# Patient Record
Sex: Male | Born: 1985 | Hispanic: No | Marital: Single | Smoking: Never smoker
Health system: Southern US, Community
[De-identification: ages and names within clinical notes are randomized; demographics above are authoritative.]

## PROBLEM LIST (undated history)

## (undated) HISTORY — PX: KNEE SURGERY: SHX244

---
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
## 2004-04-04 ENCOUNTER — Emergency Department (HOSPITAL_COMMUNITY): Admission: EM | Admit: 2004-04-04 | Discharge: 2004-04-04 | Payer: Self-pay | Admitting: Emergency Medicine

## 2005-07-08 ENCOUNTER — Emergency Department (HOSPITAL_COMMUNITY): Admission: EM | Admit: 2005-07-08 | Discharge: 2005-07-08 | Payer: Self-pay | Admitting: Emergency Medicine

## 2005-08-05 ENCOUNTER — Emergency Department (HOSPITAL_COMMUNITY): Admission: EM | Admit: 2005-08-05 | Discharge: 2005-08-05 | Payer: Self-pay | Admitting: Emergency Medicine

## 2006-08-20 ENCOUNTER — Emergency Department (HOSPITAL_COMMUNITY): Admission: EM | Admit: 2006-08-20 | Discharge: 2006-08-20 | Payer: Self-pay | Admitting: Emergency Medicine

## 2007-10-09 ENCOUNTER — Emergency Department (HOSPITAL_COMMUNITY): Admission: EM | Admit: 2007-10-09 | Discharge: 2007-10-09 | Payer: Self-pay | Admitting: Emergency Medicine

## 2008-01-29 ENCOUNTER — Emergency Department (HOSPITAL_COMMUNITY): Admission: EM | Admit: 2008-01-29 | Discharge: 2008-01-30 | Payer: Self-pay | Admitting: Emergency Medicine

## 2009-07-19 ENCOUNTER — Emergency Department (HOSPITAL_COMMUNITY): Admission: EM | Admit: 2009-07-19 | Discharge: 2009-07-19 | Payer: Self-pay | Admitting: Emergency Medicine

## 2009-11-09 ENCOUNTER — Emergency Department (HOSPITAL_COMMUNITY): Admission: EM | Admit: 2009-11-09 | Discharge: 2009-11-09 | Payer: Self-pay | Admitting: Emergency Medicine

## 2010-11-17 ENCOUNTER — Emergency Department (HOSPITAL_COMMUNITY)
Admission: EM | Admit: 2010-11-17 | Discharge: 2010-11-17 | Disposition: A | Discharge status: Home / Self Care | Payer: Self-pay | Attending: Emergency Medicine | Admitting: Emergency Medicine

## 2010-11-17 DIAGNOSIS — H5710 Ocular pain, unspecified eye: Secondary | ICD-10-CM | POA: Insufficient documentation

## 2010-11-17 DIAGNOSIS — T1510XA Foreign body in conjunctival sac, unspecified eye, initial encounter: Secondary | ICD-10-CM | POA: Insufficient documentation

## 2010-11-17 DIAGNOSIS — T1590XA Foreign body on external eye, part unspecified, unspecified eye, initial encounter: Secondary | ICD-10-CM | POA: Insufficient documentation

## 2010-11-17 DIAGNOSIS — H11419 Vascular abnormalities of conjunctiva, unspecified eye: Secondary | ICD-10-CM | POA: Insufficient documentation

## 2010-11-17 DIAGNOSIS — S0510XA Contusion of eyeball and orbital tissues, unspecified eye, initial encounter: Secondary | ICD-10-CM | POA: Insufficient documentation

## 2010-11-17 DIAGNOSIS — F411 Generalized anxiety disorder: Secondary | ICD-10-CM | POA: Insufficient documentation

## 2012-01-12 ENCOUNTER — Emergency Department (HOSPITAL_COMMUNITY)
Admission: EM | Admit: 2012-01-12 | Discharge: 2012-01-12 | Disposition: A | Discharge status: Home / Self Care | Payer: Self-pay | Attending: Emergency Medicine | Admitting: Emergency Medicine

## 2012-01-12 ENCOUNTER — Encounter (HOSPITAL_COMMUNITY): Payer: Self-pay | Admitting: Emergency Medicine

## 2012-01-12 DIAGNOSIS — B029 Zoster without complications: Secondary | ICD-10-CM | POA: Insufficient documentation

## 2012-01-12 MED ORDER — HYDROCODONE-ACETAMINOPHEN 5-325 MG PO TABS: ORAL_TABLET | ORAL | Status: DC

## 2012-01-12 MED ORDER — ACYCLOVIR 400 MG PO TABS: 800.0000 mg | ORAL_TABLET | Freq: Every day | ORAL | Status: DC

## 2012-01-12 MED ORDER — ACYCLOVIR 800 MG PO TABS: 800.0000 mg | ORAL_TABLET | Freq: Every day | ORAL | Status: DC

## 2012-01-12 NOTE — ED Notes (Signed)
Pt c/o rash that wraps around the left breast area x 3 days.

## 2012-01-12 NOTE — ED Notes (Signed)
Pt presents with rash on left rib area, both front and back.  Pt denies pain at this time, states it just itches. NAD noted

## 2012-01-12 NOTE — ED Provider Notes (Signed)
History     CSN: [id number]  Arrival date & time 01/12/12  1807   First MD Initiated Contact with Patient 01/12/12 1818      Chief Complaint  Patient presents with  . Rash    HPI Pt was seen at 1830.  Per pt, c/o gradual onset and persistence of constant "rash" on his left chest wall that began 2 days ago.  Pt states he began to "itch" on his left chest wall approx 3 days ago, then noticed a "red rash with blisters" appear the next day (2 days ago).  Denies any other symptoms. Denies fevers, no SOB/cough, no abd pain, no N/V/D, no other areas of rash.    History reviewed. No pertinent past medical history.  Past Surgical History  Procedure Date  . Knee surgery      History  Substance Use Topics  . Smoking status: Never Smoker   . Smokeless tobacco: Not on file  . Alcohol Use: No      Review of Systems ROS: Statement: All systems negative except as marked or noted in the HPI; Constitutional: Negative for fever and chills. ; ; Eyes: Negative for eye pain, redness and discharge. ; ; ENMT: Negative for ear pain, hoarseness, nasal congestion, sinus pressure and sore throat. ; ; Cardiovascular: Negative for chest pain, palpitations, diaphoresis, dyspnea and peripheral edema. ; ; Respiratory: Negative for cough, wheezing and stridor. ; ; Gastrointestinal: Negative for nausea, vomiting, diarrhea, abdominal pain, blood in stool, hematemesis, jaundice and rectal bleeding. . ; ; Genitourinary: Negative for dysuria, flank pain and hematuria. ; ; Musculoskeletal: Negative for back pain and neck pain. Negative for swelling and trauma.; ; Skin: Negative for abrasions, bruising and +skin lesion.; ; Neuro: Negative for headache, lightheadedness and neck stiffness. Negative for weakness, altered level of consciousness , altered mental status, extremity weakness, paresthesias, involuntary movement, seizure and syncope.       Allergies  Review of patient's allergies indicates no known  allergies.  Home Medications   Current Outpatient Rx  Name Route Sig Dispense Refill  . ACYCLOVIR 800 MG PO TABS Oral Take 1 tablet (800 mg total) by mouth 5 (five) times daily. For the next 7 days 35 tablet 0  . HYDROCODONE-ACETAMINOPHEN 5-325 MG PO TABS  1 or 2 tabs PO q6 hours prn pain 20 tablet 0    BP 146/98  Pulse 95  Temp 98 F (36.7 C) (Oral)  Resp 24  Ht 5\' 9"  (1.753 m)  Wt 225 lb (102.059 kg)  BMI 33.23 kg/m2  SpO2 99%  Physical Exam 1835: Physical examination:  Nursing notes reviewed; Vital signs and O2 SAT reviewed;  Constitutional: Well developed, Well nourished, Well hydrated, In no acute distress; Head:  Normocephalic, atraumatic; Eyes: EOMI, PERRL, No scleral icterus; ENMT: Mouth and pharynx normal, Mucous membranes moist; Neck: Supple, Full range of motion, No lymphadenopathy; Cardiovascular: Regular rate and rhythm, No murmur, rub, or gallop; Respiratory: Breath sounds clear & equal bilaterally, No rales, rhonchi, wheezes.  Speaking full sentences with ease, Normal respiratory effort/excursion; Chest: Nontender, Movement normal;; Extremities: Pulses normal, No tenderness, No edema, No calf edema or asymmetry.; Neuro: AA&Ox3, Major CN grossly intact.  Speech clear. No gross focal motor or sensory deficits in extremities.; Skin: Color normal, Warm, Dry, +vesicular rash with erythematous patches in dermatomal distribution left posterior to anterior chest wall. No drainage, no open wounds, no ecchymosis.    ED Course  Procedures    MDM  MDM Reviewed: nursing note and  vitals      1905:  Rash appears to be shingles; will tx with acyclovir.  Dx d/w pt and family.  Questions answered.  Verb understanding, agreeable to d/c home with outpt f/u.      Laray Anger, DO 01/13/12 1559

## 2013-07-08 ENCOUNTER — Other Ambulatory Visit (HOSPITAL_COMMUNITY): Payer: Self-pay | Admitting: Orthopedic Surgery

## 2013-07-08 DIAGNOSIS — M25561 Pain in right knee: Secondary | ICD-10-CM

## 2013-07-13 ENCOUNTER — Ambulatory Visit (HOSPITAL_COMMUNITY)
Admission: RE | Admit: 2013-07-13 | Discharge: 2013-07-13 | Disposition: A | Discharge status: Home / Self Care | Payer: 59 | Source: Ambulatory Visit | Attending: Orthopedic Surgery | Admitting: Orthopedic Surgery

## 2013-07-13 DIAGNOSIS — S99919A Unspecified injury of unspecified ankle, initial encounter: Principal | ICD-10-CM

## 2013-07-13 DIAGNOSIS — S8990XA Unspecified injury of unspecified lower leg, initial encounter: Secondary | ICD-10-CM | POA: Insufficient documentation

## 2013-07-13 DIAGNOSIS — M25569 Pain in unspecified knee: Secondary | ICD-10-CM | POA: Insufficient documentation

## 2013-07-13 DIAGNOSIS — X58XXXA Exposure to other specified factors, initial encounter: Secondary | ICD-10-CM | POA: Insufficient documentation

## 2013-07-13 DIAGNOSIS — M25561 Pain in right knee: Secondary | ICD-10-CM

## 2013-07-13 DIAGNOSIS — S99929A Unspecified injury of unspecified foot, initial encounter: Principal | ICD-10-CM

## 2013-08-19 ENCOUNTER — Ambulatory Visit
Admission: RE | Admit: 2013-08-19 | Discharge: 2013-08-19 | Disposition: A | Discharge status: Home / Self Care | Payer: 59 | Source: Ambulatory Visit | Attending: Medical | Admitting: Medical

## 2013-08-19 ENCOUNTER — Other Ambulatory Visit: Payer: Self-pay | Admitting: Medical

## 2013-08-19 ENCOUNTER — Encounter: Payer: Self-pay | Admitting: Medical

## 2013-08-19 ENCOUNTER — Ambulatory Visit (INDEPENDENT_AMBULATORY_CARE_PROVIDER_SITE_OTHER): Payer: 59 | Admitting: Medical

## 2013-08-19 VITALS — BP 122/88 | HR 80 | Temp 98.0°F | Resp 18 | Ht 70.0 in | Wt 250.0 lb

## 2013-08-19 DIAGNOSIS — R0602 Shortness of breath: Secondary | ICD-10-CM

## 2013-08-19 DIAGNOSIS — R079 Chest pain, unspecified: Secondary | ICD-10-CM

## 2013-08-19 DIAGNOSIS — J302 Other seasonal allergic rhinitis: Secondary | ICD-10-CM

## 2013-08-19 DIAGNOSIS — J45909 Unspecified asthma, uncomplicated: Secondary | ICD-10-CM

## 2013-08-19 DIAGNOSIS — J309 Allergic rhinitis, unspecified: Secondary | ICD-10-CM

## 2013-08-19 LAB — CBC WITH DIFFERENTIAL/PLATELET
Basophils Absolute: 0.1 10*3/uL (ref 0.0–0.1)
Basophils Relative: 1 % (ref 0–1)
EOS PCT: 7 % — AB (ref 0–5)
Eosinophils Absolute: 0.6 10*3/uL (ref 0.0–0.7)
HEMATOCRIT: 44.4 % (ref 39.0–52.0)
Hemoglobin: 15.7 g/dL (ref 13.0–17.0)
LYMPHS ABS: 2.8 10*3/uL (ref 0.7–4.0)
LYMPHS PCT: 30 % (ref 12–46)
MCH: 27.7 pg (ref 26.0–34.0)
MCHC: 35.4 g/dL (ref 30.0–36.0)
MCV: 78.4 fL (ref 78.0–100.0)
MONO ABS: 0.7 10*3/uL (ref 0.1–1.0)
MONOS PCT: 8 % (ref 3–12)
Neutro Abs: 5 10*3/uL (ref 1.7–7.7)
Neutrophils Relative %: 54 % (ref 43–77)
Platelets: 297 10*3/uL (ref 150–400)
RBC: 5.66 MIL/uL (ref 4.22–5.81)
RDW: 14.3 % (ref 11.5–15.5)
WBC: 9.2 10*3/uL (ref 4.0–10.5)

## 2013-08-19 LAB — BASIC METABOLIC PANEL
BUN: 19 mg/dL (ref 6–23)
CALCIUM: 9.3 mg/dL (ref 8.4–10.5)
CO2: 22 mEq/L (ref 19–32)
Chloride: 106 mEq/L (ref 96–112)
Creat: 0.74 mg/dL (ref 0.50–1.35)
Glucose, Bld: 79 mg/dL (ref 70–99)
Potassium: 3.9 mEq/L (ref 3.5–5.3)
SODIUM: 137 meq/L (ref 135–145)

## 2013-08-19 LAB — LIPID PANEL
CHOL/HDL RATIO: 3.9 ratio
Cholesterol: 139 mg/dL (ref 0–200)
HDL: 36 mg/dL — AB (ref >39)
LDL CALC: 76 mg/dL (ref 0–99)
Triglycerides: 137 mg/dL (ref <150)
VLDL: 27 mg/dL (ref 0–40)

## 2013-08-19 MED ORDER — ALBUTEROL SULFATE HFA 108 (90 BASE) MCG/ACT IN AERS: 2.0000 | INHALATION_SPRAY | Freq: Four times a day (QID) | RESPIRATORY_TRACT | Status: AC | PRN

## 2013-08-19 NOTE — Progress Notes (Deleted)
   Subjective:   Roger Hammond is a 28 y.o. male presenting on 08/19/2013 with chest pain here as a new patient.   Lately been having some SOB, chest pains.  Pain comes and goes, lasts 10-15 seconds and resolves.    Works in a [employer] *** No other aggravating or relieving factors.  No other complaint.  Review of Systems ROS as in subjective      Objective:    Objective: Filed Vitals:   08/19/13 1009  BP: 122/88  Pulse: 80  Temp: 98 F (36.7 C)  Resp: 18    General appearance: alert, no distress, WD/WN HEENT: normocephalic, sclerae anicteric, TMs pearly, nares patent, no discharge or erythema, pharynx normal Oral cavity: MMM, no lesions Neck: supple, no lymphadenopathy, no thyromegaly, no masses Heart: RRR, normal S1, S2, no murmurs Lungs: CTA bilaterally, no wheezes, rhonchi, or rales Abdomen: +bs, soft, non tender, non distended, no masses, no hepatomegaly, no splenomegaly Pulses: 2+ symmetric, upper and lower extremities, normal cap refill      Assessment: No diagnosis found.   Plan:  Dot assessplan There are no diagnoses linked to this encounter.  *** No Follow-up on file.

## 2013-08-19 NOTE — Progress Notes (Signed)
Subjective:    Roger Hammond is a 28 y.o. male who presents as a new patient.   His father sees me for care here.    Here for evaluation of chest pain. Onset was 1 year ago. Symptoms have worsened since that time. The patient describes the pain as dull, pressure and does not radiate. Patient rates pain as mild in intensity. Associated symptoms are: dyspnea. Aggravating factors are: can be out of the blue, but sometimes with activity. Alleviating factors are: none. Patient's cardiac risk factors are: male gender, obesity (BMI >= 30 kg/m2) and sedentary lifestyle. Patient's risk factors for DVT/PE: none. Previous cardiac testing: none.  He does have remote hx/o asthma as a child, never been on medication.  Has allergy problems, including current runny nose, ocngestion, itching, not on medication for this.  The following portions of the patient's history were reviewed and updated as appropriate: allergies, current medications, past family history, past medical history, past social history, past surgical history and problem list.     Review of Systems Constitutional: denies fever, chills, sweats, unexpected weight change, anorexia, fatigue Gastroenterology: denies abdominal pain, nausea, vomiting, diarrhea, constipation,  Hematology: denies bleeding or bruising problems Musculoskeletal: denies arthralgias, myalgias, joint swelling, back pain, neck pain, cramping, gait changes Urology: denies dysuria, difficulty urinating, hematuria, urinary frequency, urgency, incontinence Neurology: no headache, weakness, tingling, numbness, speech abnormality, memory loss, falls, dizziness   Objective:      Filed Vitals:   08/19/13 1009  BP: 122/88  Pulse: 80  Temp: 98 F (36.7 C)  Resp: 18    General appearance: alert, no distress, WD/WN,obese hispanic male  HEENT: normocephalic, conjunctiva/corneas normal, sclerae anicteric, PERRLA, EOMi, nares patent, no discharge or erythema, pharynx normal Oral  cavity: MMM, tongue normal Neck: supple, no lymphadenopathy, no thyromegaly, no JVD, no bruits, no masses, normal ROM Chest: non tender, normal shape and expansion Heart: RRR, normal S1, S2, no murmurs Lungs: few faint expiratory wheezes, no rhonchi, or rales Abdomen: +bs, soft, non tender, non distended, no masses, no hepatomegaly, no splenomegaly, no bruits Extremities: no edema, no cyanosis, no clubbing Pulses: 2+ symmetric, upper and lower extremities, normal cap refill   Adult ECG Report  Indication: chest pain, SOB  Rate: 69bpm  Rhythm: normal sinus rhythm  QRS Axis: 33 degrees  PR Interval:  QRS Duration:  QTc:  Conduction Disturbances: none  Other Abnormalities: none  Patient's cardiac risk factors are: male gender, obesity (BMI >= 30 kg/m2) and sedentary lifestyle.  EKG comparison: none  Narrative Interpretation: nonspecific intraventricular conduction delay, otherwise unremarkable, prolonged QRS    Assessment:   Encounter Diagnoses  Name Primary?  . Chest pain Yes  . SOB (shortness of breath)   . Unspecified asthma(493.90)   . Seasonal allergies      Plan:   I suspect asthma, however his symptoms are somewhat concerning for cardiac but without lots of risk factors.  He is worried as he had a coworker drop dead from a heart attack in front of him.  Reviewed EKG, will send for chest x-ray, labs today, followup pending labs .  Consider albuterol inhaler, advise he begin allergy medication daily over-the-counter

## 2013-08-20 ENCOUNTER — Encounter: Payer: Self-pay | Admitting: Family Medicine

## 2013-08-20 LAB — TSH: TSH: 2.699 u[IU]/mL (ref 0.350–4.500)

## 2014-02-07 ENCOUNTER — Encounter (HOSPITAL_COMMUNITY): Payer: Self-pay | Admitting: Emergency Medicine

## 2014-02-07 ENCOUNTER — Emergency Department (HOSPITAL_COMMUNITY): Payer: 59

## 2014-02-07 ENCOUNTER — Emergency Department (HOSPITAL_COMMUNITY)
Admission: EM | Admit: 2014-02-07 | Discharge: 2014-02-07 | Disposition: A | Discharge status: Home / Self Care | Payer: 59 | Attending: Emergency Medicine | Admitting: Emergency Medicine

## 2014-02-07 DIAGNOSIS — M549 Dorsalgia, unspecified: Secondary | ICD-10-CM | POA: Insufficient documentation

## 2014-02-07 LAB — URINALYSIS, ROUTINE W REFLEX MICROSCOPIC
BILIRUBIN URINE: NEGATIVE
Glucose, UA: NEGATIVE mg/dL
Hgb urine dipstick: NEGATIVE
Ketones, ur: NEGATIVE mg/dL
Leukocytes, UA: NEGATIVE
NITRITE: NEGATIVE
PROTEIN: NEGATIVE mg/dL
UROBILINOGEN UA: 0.2 mg/dL (ref 0.0–1.0)
pH: 5.5 (ref 5.0–8.0)

## 2014-02-07 MED ORDER — NAPROXEN 500 MG PO TABS: 500.0000 mg | ORAL_TABLET | Freq: Two times a day (BID) | ORAL | Status: AC

## 2014-02-07 MED ORDER — CYCLOBENZAPRINE HCL 10 MG PO TABS: 10.0000 mg | ORAL_TABLET | Freq: Three times a day (TID) | ORAL | Status: AC | PRN

## 2014-02-07 MED ORDER — OXYCODONE-ACETAMINOPHEN 5-325 MG PO TABS: 1.0000 | ORAL_TABLET | ORAL | Status: AC | PRN

## 2014-02-07 NOTE — ED Notes (Signed)
Low back pain,x 5 days, no injury, Increased pain with movement.

## 2014-02-07 NOTE — Discharge Instructions (Signed)
Back Pain, Adult °Back pain is very common. The pain often gets better over time. The cause of back pain is usually not dangerous. Most people can learn to manage their back pain on their own.  °HOME CARE  °· Stay active. Start with short walks on flat ground if you can. Try to walk farther each day. °· Do not sit, drive, or stand in one place for more than 30 minutes. Do not stay in bed. °· Do not avoid exercise or work. Activity can help your back heal faster. °· Be careful when you bend or lift an object. Bend at your knees, keep the object close to you, and do not twist. °· Sleep on a firm mattress. Lie on your side, and bend your knees. If you lie on your back, put a pillow under your knees. °· Only take medicines as told by your doctor. °· Put ice on the injured area. °¨ Put ice in a plastic bag. °¨ Place a towel between your skin and the bag. °¨ Leave the ice on for 15-20 minutes, 03-04 times a day for the first 2 to 3 days. After that, you can switch between ice and heat packs. °· Ask your doctor about back exercises or massage. °· Avoid feeling anxious or stressed. Find good ways to deal with stress, such as exercise. °GET HELP RIGHT AWAY IF:  °· Your pain does not go away with rest or medicine. °· Your pain does not go away in 1 week. °· You have new problems. °· You do not feel well. °· The pain spreads into your legs. °· You cannot control when you poop (bowel movement) or pee (urinate). °· Your arms or legs feel weak or lose feeling (numbness). °· You feel sick to your stomach (nauseous) or throw up (vomit). °· You have belly (abdominal) pain. °· You feel like you may pass out (faint). °MAKE SURE YOU:  °· Understand these instructions. °· Will watch your condition. °· Will get help right away if you are not doing well or get worse. °Document Released: 08/28/2007 Document Revised: 06/03/2011 Document Reviewed: 07/13/2013 °[employer]® Patient Information ©2015 [employer]. This information is not intended  to replace advice given to you by your health care provider. Make sure you discuss any questions you have with your health care provider. ° °

## 2014-02-07 NOTE — ED Provider Notes (Signed)
CSN: [id number]     Arrival date & time 02/07/14  1513 History  This chart was scribed for non-physician practitioner, Pauline Ausammy Maleeya Peterkin, PA-C,working with Juliet RudeNathan R. Rubin PayorPickering, MD, by Karle PlumberJennifer Tensley, ED Scribe. This patient was seen in room APFT23/APFT23 and the patient's care was started at 4:34 PM.  Chief Complaint  Patient presents with  . Back Pain   Patient is a 28 y.o. male presenting with back pain. The history is provided by the patient. No language interpreter was used.  Back Pain Associated symptoms: no abdominal pain, no chest pain, no dysuria, no fever, no numbness and no weakness     HPI Comments:  Roger Hammond is a 28 y.o. obese male who presents to the Emergency Department complaining of worsening severe upper lumbar back pain for about one week. He describes the pain as a stabbing pain that is worsened with bending and movement. Deep breathing also makes the pain worse. He states resting helps to alleviate the pain. Pt has been taking Tylenol and Advil for the pain with minimal relief. He reports he bends a lot for his job. He denies numbness, tingling, or weakness of the lower extremities, abdominal pain, dysuria, hematuria, bowel or bladder incontinence. He denies any trauma, injury or fall. Denies any recent illnesses.   History reviewed. No pertinent past medical history. Past Surgical History  Procedure Laterality Date  . Knee surgery     Family History  Problem Relation Age of Onset  . Asthma Brother    History  Substance Use Topics  . Smoking status: Never Smoker   . Smokeless tobacco: Not on file  . Alcohol Use: Yes     Comment: occassionally    Review of Systems  Constitutional: Negative for fever, chills and fatigue.  HENT: Negative for sore throat and trouble swallowing.   Respiratory: Negative for cough, shortness of breath and wheezing.   Cardiovascular: Negative for chest pain and palpitations.  Gastrointestinal: Negative for nausea, vomiting,  abdominal pain and blood in stool.  Genitourinary: Negative for dysuria, hematuria and flank pain.  Musculoskeletal: Positive for back pain. Negative for myalgias, arthralgias, neck pain and neck stiffness.  Skin: Negative for rash.  Neurological: Negative for dizziness, weakness and numbness.  Hematological: Does not bruise/bleed easily.    Allergies  Review of patient's allergies indicates no known allergies.  Home Medications   Prior to Admission medications   Medication Sig Start Date End Date Taking? Authorizing Provider  albuterol (PROVENTIL HFA;VENTOLIN HFA) 108 (90 BASE) MCG/ACT inhaler Inhale 2 puffs into the lungs every 6 (six) hours as needed for wheezing or shortness of breath. 08/19/13   Jac Canavanavid S Tysinger, PA-C   Triage Vitals: BP 129/83 mmHg  Pulse 95  Temp(Src) 97.9 F (36.6 C) (Oral)  Resp 20  Ht 5\' 11"  (1.803 m)  Wt 260 lb (117.935 kg)  BMI 36.28 kg/m2  SpO2 98% Physical Exam  Constitutional: He is oriented to person, place, and time. He appears well-developed and well-nourished. No distress.  HENT:  Head: Normocephalic and atraumatic.  Neck: Normal range of motion. Neck supple.  Cardiovascular: Normal rate, regular rhythm, normal heart sounds and intact distal pulses.   No murmur heard. Pulmonary/Chest: Effort normal and breath sounds normal. No respiratory distress.  Abdominal: Soft. He exhibits no distension. There is no tenderness. There is CVA tenderness (right sided).  Musculoskeletal: He exhibits tenderness. He exhibits no edema.       Lumbar back: He exhibits tenderness and pain. He exhibits normal  range of motion, no swelling, no deformity, no laceration and normal pulse.  ttp of the upper lumbar paraspinal muscles. DP pulses are brisk and symmetrical.  Distal sensation intact.  Hip Flexors/Extensors are intact.  Pt has 5/5 strength against resistance of bilateral lower extremities. Small area of ecchymosis to right lower back.   Neurological: He is  alert and oriented to person, place, and time. He has normal strength. No sensory deficit. He exhibits normal muscle tone. Coordination and gait normal.  Reflex Scores:      Patellar reflexes are 2+ on the right side and 2+ on the left side.      Achilles reflexes are 2+ on the right side and 2+ on the left side. Skin: Skin is warm and dry. No rash noted.  Nursing note and vitals reviewed.   ED Course  Procedures (including critical care time) DIAGNOSTIC STUDIES: Oxygen Saturation is 98% on RA, normal by my interpretation.   COORDINATION OF CARE: 4:39 PM- Will X-Ray lumbar spine and order urinalysis. Pt verbalizes understanding and agrees to plan.  Medications - No data to display  Labs Review    Imaging Review Dg Chest 2 View  02/07/2014   CLINICAL DATA:  Back pain.  EXAM: CHEST  2 VIEW  COMPARISON:  Aug 19, 2013.  FINDINGS: The heart size and mediastinal contours are within normal limits. Both lungs are clear. No pneumothorax or pleural effusion is noted. The visualized skeletal structures are unremarkable.  IMPRESSION: No acute cardiopulmonary abnormality seen.   Electronically Signed   By: Roque LiasJames  Green M.D.   On: 02/07/2014 17:26       EKG Interpretation None      MDM   Final diagnoses:  Back pain    Pt is well appearing, ambulates with a steady gait.  No concerning sx's for emergent neurological or infectious process.  Agrees to symptomatic tx with flexeril, naprosyn and percocet.     I personally performed the services described in this documentation, which was scribed in my presence. The recorded information has been reviewed and is accurate.    Veralyn Lopp L. Trisha Mangleriplett, PA-C 02/09/14 1738  Juliet RudeNathan R. Rubin PayorPickering, MD 02/09/14 [id number]

## 2014-02-07 NOTE — ED Notes (Signed)
Onset 2 days felt pulling in lower back when trying to get up from chair, pain has gotten worse, OTC medication has not helped, using heat pad without relief.

## 2015-06-30 ENCOUNTER — Encounter (HOSPITAL_COMMUNITY): Payer: Self-pay | Admitting: Emergency Medicine

## 2015-06-30 ENCOUNTER — Emergency Department (HOSPITAL_COMMUNITY): Payer: Managed Care, Other (non HMO)

## 2015-06-30 ENCOUNTER — Emergency Department (HOSPITAL_COMMUNITY)
Admission: EM | Admit: 2015-06-30 | Discharge: 2015-06-30 | Disposition: A | Discharge status: Home / Self Care | Payer: Managed Care, Other (non HMO) | Attending: Emergency Medicine | Admitting: Emergency Medicine

## 2015-06-30 DIAGNOSIS — Z79899 Other long term (current) drug therapy: Secondary | ICD-10-CM | POA: Diagnosis not present

## 2015-06-30 DIAGNOSIS — B9789 Other viral agents as the cause of diseases classified elsewhere: Secondary | ICD-10-CM

## 2015-06-30 DIAGNOSIS — J069 Acute upper respiratory infection, unspecified: Secondary | ICD-10-CM | POA: Insufficient documentation

## 2015-06-30 DIAGNOSIS — R05 Cough: Secondary | ICD-10-CM | POA: Diagnosis present

## 2015-06-30 DIAGNOSIS — R059 Cough, unspecified: Secondary | ICD-10-CM

## 2015-06-30 MED ORDER — BENZONATATE 100 MG PO CAPS: 100.0000 mg | ORAL_CAPSULE | Freq: Three times a day (TID) | ORAL | Status: AC

## 2015-06-30 MED ORDER — PROMETHAZINE-DM 6.25-15 MG/5ML PO SYRP: 5.0000 mL | ORAL_SOLUTION | Freq: Four times a day (QID) | ORAL | Status: AC | PRN

## 2015-06-30 NOTE — ED Notes (Signed)
Pt ambulates independently and with steady gait at time of discharge. Discharge instructions and follow up information reviewed with patient. No other questions or concerns voiced at this time. RX x 2. 

## 2015-06-30 NOTE — ED Provider Notes (Signed)
CSN: [id number]     Arrival date & time 06/30/15  1339 History  By signing my name below, I, Ronney LionSuzanne Le, attest that this documentation has been prepared under the direction and in the presence of Fayrene HelperBowie Alinda Egolf, PA-C. Electronically Signed: Ronney LionSuzanne Le, ED Scribe. 06/30/2015. 2:19 PM.    Chief Complaint  Patient presents with  . Cough   The history is provided by the patient. No language interpreter was used.    HPI Comments: Roger Hammond is a 30 y.o. male who presents to the Emergency Department complaining of a gradual-onset, constant, moderate, productive cough that began 2 weeks ago. He then gradually developed associated sinus congestion, subjective fever and chills, wheezing, nasal congestion, and sore throat. He reports sick contact with his children, who have similar symptoms. He states his cough is worse at night, causing trouble sleeping. Patient has taken Nyquil and OTC cough medication with no relief. He denies a history of asthma. He denies taking any regular medications. He denies otalgia, vomiting, diarrhea, rash, SOB, or eye itching. He denies recent travel. He denies a history of smoking. He denies receiving a flu vaccine this year.  No past medical history on file. Past Surgical History  Procedure Laterality Date  . Knee surgery     Family History  Problem Relation Age of Onset  . Asthma Brother    Social History  Substance Use Topics  . Smoking status: Never Smoker   . Smokeless tobacco: Not on file  . Alcohol Use: Yes     Comment: occassionally    Review of Systems  Constitutional: Positive for fever (subjective) and chills.  HENT: Positive for congestion and sore throat. Negative for ear pain.   Eyes: Negative for itching.  Respiratory: Positive for cough and wheezing. Negative for shortness of breath.   Gastrointestinal: Negative for vomiting and diarrhea.  Skin: Negative for rash.   Allergies  Review of patient's allergies indicates no known  allergies.  Home Medications   Prior to Admission medications   Medication Sig Start Date End Date Taking? Authorizing Provider  albuterol (PROVENTIL HFA;VENTOLIN HFA) 108 (90 BASE) MCG/ACT inhaler Inhale 2 puffs into the lungs every 6 (six) hours as needed for wheezing or shortness of breath. 08/19/13   Kermit Baloavid S Tysinger, PA-C  cyclobenzaprine (FLEXERIL) 10 MG tablet Take 1 tablet (10 mg total) by mouth 3 (three) times daily as needed. 02/07/14   Tammy Triplett, PA-C  naproxen (NAPROSYN) 500 MG tablet Take 1 tablet (500 mg total) by mouth 2 (two) times daily with a meal. 02/07/14   Tammy Triplett, PA-C  oxyCODONE-acetaminophen (PERCOCET/ROXICET) 5-325 MG per tablet Take 1 tablet by mouth every 4 (four) hours as needed. 02/07/14   Tammy Triplett, PA-C   BP 115/88 mmHg  Pulse 95  Temp(Src) 98.2 F (36.8 C) (Oral)  Resp 20  SpO2 95% Physical Exam  Constitutional: He is oriented to person, place, and time. He appears well-developed and well-nourished. No distress.  HENT:  Head: Atraumatic. Microcephalic.  Right Ear: Tympanic membrane normal.  Left Ear: Tympanic membrane is erythematous.  No middle ear effusion.  Nose: Rhinorrhea present.  Mouth/Throat: Uvula is midline. No trismus in the jaw.  Right TM normal. Left TM mildly erythematous, but no effusion.  Mild rhinorrhea. Uvula midline. No tonsillar enlargement or exudate. No trismus.  Eyes: Conjunctivae and EOM are normal.  Neck: Neck supple. No tracheal deviation present.  Trachea is midline.  Cardiovascular: Normal rate.   Pulmonary/Chest: Effort normal. No respiratory distress.  He has no wheezes. He has rhonchi. He has no rales.  Mild scattered rhonchi. No wheezes or rales.  Musculoskeletal: Normal range of motion.  Neurological: He is alert and oriented to person, place, and time.  Skin: Skin is warm and dry.  Psychiatric: He has a normal mood and affect. His behavior is normal.  Nursing note and vitals reviewed.   ED Course   Procedures (including critical care time)  DIAGNOSTIC STUDIES: Oxygen Saturation is 95& on RA, normal by my interpretation.    COORDINATION OF CARE: 2:06 PM - Discussed treatment plan with pt at bedside which includes CXR to r/o pneumonia. Pt verbalized understanding and agreed to plan.   2:54 PM cxr neg.  Reassurance given.  sxs treatment provided.  F/u with pcp. Doubt PE ACS or other acute medical condition. Doubt allergies  Imaging Review Dg Chest 2 View  06/30/2015  CLINICAL DATA:  Productive cough, shortness of breath. EXAM: CHEST  2 VIEW COMPARISON:  February 07, 2014. FINDINGS: The heart size and mediastinal contours are within normal limits. Both lungs are clear. No pneumothorax or pleural effusion is noted. The visualized skeletal structures are unremarkable. IMPRESSION: No active cardiopulmonary disease. Electronically Signed   By: Lupita Raider, M.D.   On: 06/30/2015 14:44   I have personally reviewed and evaluated these images and lab results as part of my medical decision-making.  MDM   Final diagnoses:  Cough  Viral upper respiratory tract infection with cough   BP 115/88 mmHg  Pulse 95  Temp(Src) 98.2 F (36.8 C) (Oral)  Resp 20  SpO2 95%   I personally performed the services described in this documentation, which was scribed in my presence. The recorded information has been reviewed and is accurate.      Fayrene Helper, PA-C 06/30/15 1454  Benjiman Core, MD 07/10/15 1145

## 2015-06-30 NOTE — ED Notes (Signed)
See PA assessment 

## 2015-06-30 NOTE — ED Notes (Signed)
Pt here for 2 week hx of cough and flu like symptoms. Pt reports cough is worse at night.

## 2015-06-30 NOTE — Discharge Instructions (Signed)
Viral Infections °A viral infection can be caused by different types of viruses. Most viral infections are not serious and resolve on their own. However, some infections may cause severe symptoms and may lead to further complications. °SYMPTOMS °Viruses can frequently cause: °· Minor sore throat. °· Aches and pains. °· Headaches. °· Runny nose. °· Different types of rashes. °· Watery eyes. °· Tiredness. °· Cough. °· Loss of appetite. °· Gastrointestinal infections, resulting in nausea, vomiting, and diarrhea. °These symptoms do not respond to antibiotics because the infection is not caused by bacteria. However, you might catch a bacterial infection following the viral infection. This is sometimes called a "superinfection." Symptoms of such a bacterial infection may include: °· Worsening sore throat with pus and difficulty swallowing. °· Swollen neck glands. °· Chills and a high or persistent fever. °· Severe headache. °· Tenderness over the sinuses. °· Persistent overall ill feeling (malaise), muscle aches, and tiredness (fatigue). °· Persistent cough. °· Yellow, green, or brown mucus production with coughing. °HOME CARE INSTRUCTIONS  °· Only take over-the-counter or prescription medicines for pain, discomfort, diarrhea, or fever as directed by your caregiver. °· Drink enough water and fluids to keep your urine clear or pale yellow. Sports drinks can provide valuable electrolytes, sugars, and hydration. °· Get plenty of rest and maintain proper nutrition. Soups and broths with crackers or rice are fine. °SEEK IMMEDIATE MEDICAL CARE IF:  °· You have severe headaches, shortness of breath, chest pain, neck pain, or an unusual rash. °· You have uncontrolled vomiting, diarrhea, or you are unable to keep down fluids. °· You or your child has an oral temperature above 102° F (38.9° C), not controlled by medicine. °· Your baby is older than 3 months with a rectal temperature of 102° F (38.9° C) or higher. °· Your baby is 3  months old or younger with a rectal temperature of 100.4° F (38° C) or higher. °MAKE SURE YOU:  °· Understand these instructions. °· Will watch your condition. °· Will get help right away if you are not doing well or get worse. °  °This information is not intended to replace advice given to you by your health care provider. Make sure you discuss any questions you have with your health care provider. °  °Document Released: 12/19/2004 Document Revised: 06/03/2011 Document Reviewed: 08/17/2014 °Elsevier Interactive Patient Education ©2016 [employer] ° °

## 2015-10-04 ENCOUNTER — Encounter (HOSPITAL_COMMUNITY): Payer: Self-pay | Admitting: Emergency Medicine

## 2015-10-04 ENCOUNTER — Emergency Department (HOSPITAL_COMMUNITY)
Admission: EM | Admit: 2015-10-04 | Discharge: 2015-10-04 | Disposition: A | Discharge status: Home / Self Care | Payer: Managed Care, Other (non HMO) | Attending: Emergency Medicine | Admitting: Emergency Medicine

## 2015-10-04 DIAGNOSIS — L255 Unspecified contact dermatitis due to plants, except food: Secondary | ICD-10-CM | POA: Insufficient documentation

## 2015-10-04 DIAGNOSIS — L237 Allergic contact dermatitis due to plants, except food: Secondary | ICD-10-CM

## 2015-10-04 MED ORDER — PREDNISONE 20 MG PO TABS: ORAL_TABLET | ORAL | Status: AC

## 2015-10-04 MED ORDER — CALAMINE EX LOTN
TOPICAL_LOTION | Freq: Once | CUTANEOUS | Status: AC
  Administered 2015-10-04: 22:00:00 via TOPICAL
  Filled 2015-10-04: qty 118

## 2015-10-04 NOTE — ED Notes (Signed)
Pt. reports itchy skin rashes at left arm onset Sunday unrelieved by OTC medications .

## 2015-10-04 NOTE — ED Provider Notes (Signed)
CSN: [id number]     Arrival date & time 10/04/15  1817 History  By signing my name below, I, Roger Hammond, attest that this documentation has been prepared under the direction and in the presence of Audry Piliyler Jadzia Ibsen PA-C.  Electronically Signed: Renetta ChalkBobby Hammond, ED Scribe. 10/04/2015. 9:11 PM   Chief Complaint  Patient presents with  . Rash   The history is provided by the patient. No language interpreter was used.   HPI Comments: Roger Hammond is a 30 y.o. male who presents to the Emergency Department complaining of a gradually worsening, 8/10 painful, pruritic rash to his bilateral upper extremities, onset four days ago. 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 Pt reports that the rash got much worse yesterday. Pt states pain is exacerbated by movement of the extremity. Pt denies any rash to his torso and legs. Pt denies taking any OTC medication. Pt denies fever, chest pain, shortness of breath.   History reviewed. No pertinent past medical history. Past Surgical History  Procedure Laterality Date  . Knee surgery     Family History  Problem Relation Age of Onset  . Asthma Brother    Social History  Substance Use Topics  . Smoking status: Never Smoker   . Smokeless tobacco: None  . Alcohol Use: Yes     Comment: occassionally    Review of Systems  Constitutional: Negative for fever.  Respiratory: Negative for shortness of breath.   Cardiovascular: Negative for chest pain.  Skin: Positive for rash (bilateral upper extremities).  All other systems reviewed and are negative.  Allergies  Review of patient's allergies indicates no known allergies.  Home Medications   Prior to Admission medications   Medication Sig Start Date End Date Taking? Authorizing Provider  albuterol (PROVENTIL HFA;VENTOLIN HFA) 108 (90 BASE) MCG/ACT inhaler Inhale 2 puffs into the lungs every 6 (six) hours as needed for wheezing or shortness of breath. 08/19/13    Kermit Baloavid S Tysinger, PA-C  benzonatate (TESSALON) 100 MG capsule Take 1 capsule (100 mg total) by mouth every 8 (eight) hours. 06/30/15   Fayrene HelperBowie Tran, PA-C  cyclobenzaprine (FLEXERIL) 10 MG tablet Take 1 tablet (10 mg total) by mouth 3 (three) times daily as needed. 02/07/14   Tammy Triplett, PA-C  naproxen (NAPROSYN) 500 MG tablet Take 1 tablet (500 mg total) by mouth 2 (two) times daily with a meal. 02/07/14   Tammy Triplett, PA-C  oxyCODONE-acetaminophen (PERCOCET/ROXICET) 5-325 MG per tablet Take 1 tablet by mouth every 4 (four) hours as needed. 02/07/14   Tammy Triplett, PA-C  promethazine-dextromethorphan (PROMETHAZINE-DM) 6.25-15 MG/5ML syrup Take 5 mLs by mouth 4 (four) times daily as needed for cough. 06/30/15   Fayrene HelperBowie Tran, PA-C   BP 141/99 mmHg  Pulse 84  Temp(Src) 97.8 F (36.6 C) (Oral)  Resp 18  Ht 5\' 9"  (1.753 m)  Wt 268 lb 5 oz (121.706 kg)  BMI 39.60 kg/m2  SpO2 100%   Physical Exam  Constitutional: He is oriented to person, place, and time. He appears well-developed and well-nourished. No distress.  HENT:  Head: Normocephalic and atraumatic.  Eyes: EOM are normal. Pupils are equal, round, and reactive to light.  Neck: Normal range of motion.  Cardiovascular: Normal rate and regular rhythm.   Pulmonary/Chest: Effort normal.  Abdominal: Soft.  Musculoskeletal: Normal range of motion.  Neurological: He is alert and oriented to person, place, and time.  Skin: Skin is warm and dry. Rash noted.  He is not diaphoretic.  Diffuse maculopapular rash on bilateral upper extremities, scattered vesicles measuring less tan 1 cm, mild weeping noted, no signs of serious infection. Mild Erythema  Psychiatric: He has a normal mood and affect. His behavior is normal. Judgment and thought content normal.  Nursing note and vitals reviewed.  ED Course  Procedures  DIAGNOSTIC STUDIES: Oxygen Saturation is 100% on RA, normal by my interpretation.  COORDINATION OF CARE: 9:09 PM-Will order  medication. Discussed treatment plan with pt at bedside and pt agreed to plan.   Labs Review Labs Reviewed - No data to display  Imaging Review No results found. I have personally reviewed and evaluated these images and lab results as part of my medical decision-making.   EKG Interpretation None      MDM  I have reviewed the relevant previous healthcare records. I obtained HPI from historian.  ED Course:  Assessment: Pt is a 30yM who presents with pruritic rash. On exam, pt in NAD. Nontoxic/nonseptic appearing. VSS. Afebrile. Diffuse rash noted on BUE. Weeping. Mild drainage with slight erythema. Given Calamine lotion in ED. Will Rx steroid taper for 21 days. Plan is to DC home with follow up to Dermatology. At time of discharge, Patient is in no acute distress. Vital Signs are stable. Patient is able to ambulate. Patient able to tolerate PO.    Disposition/Plan:  DC Home Additional Verbal discharge instructions given and discussed with patient.  Pt Instructed to f/u with PCP in the next week for evaluation and treatment of symptoms. Return precautions given Pt acknowledges and agrees with plan  Supervising Physician Lyndal Pulley, MD   Final diagnoses:  Poison ivy dermatitis    I personally performed the services described in this documentation, which was scribed in my presence. The recorded information has been reviewed and is accurate.   Audry Pili, PA-C 10/04/15 2142  Lyndal Pulley, MD 10/05/15 [phone]

## 2015-10-04 NOTE — Discharge Instructions (Signed)
Please read and follow all provided instructions.  Your diagnoses today include:  1. Poison ivy dermatitis    Tests performed today include:  Vital signs. See below for your results today.   Medications prescribed:   Take as prescribed. You will be taking the steroids over a 21 day period. Take them as written. Do not skip a dose or the rash will reappear   Home care instructions:  Follow any educational materials contained in this packet.  Follow-up instructions: Please follow-up with your primary care provider for further evaluation of symptoms and treatment   Return instructions:   Please return to the Emergency Department if you do not get better, if you get worse, or new symptoms OR  - Fever (temperature greater than 101.55F)  - Bleeding that does not stop with holding pressure to the area    -Severe pain (please note that you may be more sore the day after your accident)  - Chest Pain  - Difficulty breathing  - Severe nausea or vomiting  - Inability to tolerate food and liquids  - Passing out  - Skin becoming red around your wounds  - Change in mental status (confusion or lethargy)  - New numbness or weakness     Please return if you have any other emergent concerns.  Additional Information:  Your vital signs today were: BP 141/99 mmHg   Pulse 84   Temp(Src) 97.8 F (36.6 C) (Oral)   Resp 18   Ht 5\' 9"  (1.753 m)   Wt 121.706 kg   BMI 39.60 kg/m2   SpO2 100% If your blood pressure (BP) was elevated above 135/85 this visit, please have this repeated by your doctor within one month. ---------------

## 2015-10-04 NOTE — ED Notes (Signed)
Patient able to ambulate independently  

## 2016-09-11 IMAGING — CR DG CHEST 2V
2 series · 2 of 2 positions shown · non-contrast
Comparison: August 19, 2013.

CLINICAL DATA: Back pain.

EXAM:
CHEST  2 VIEW

[view not recorded (1 of 2)]
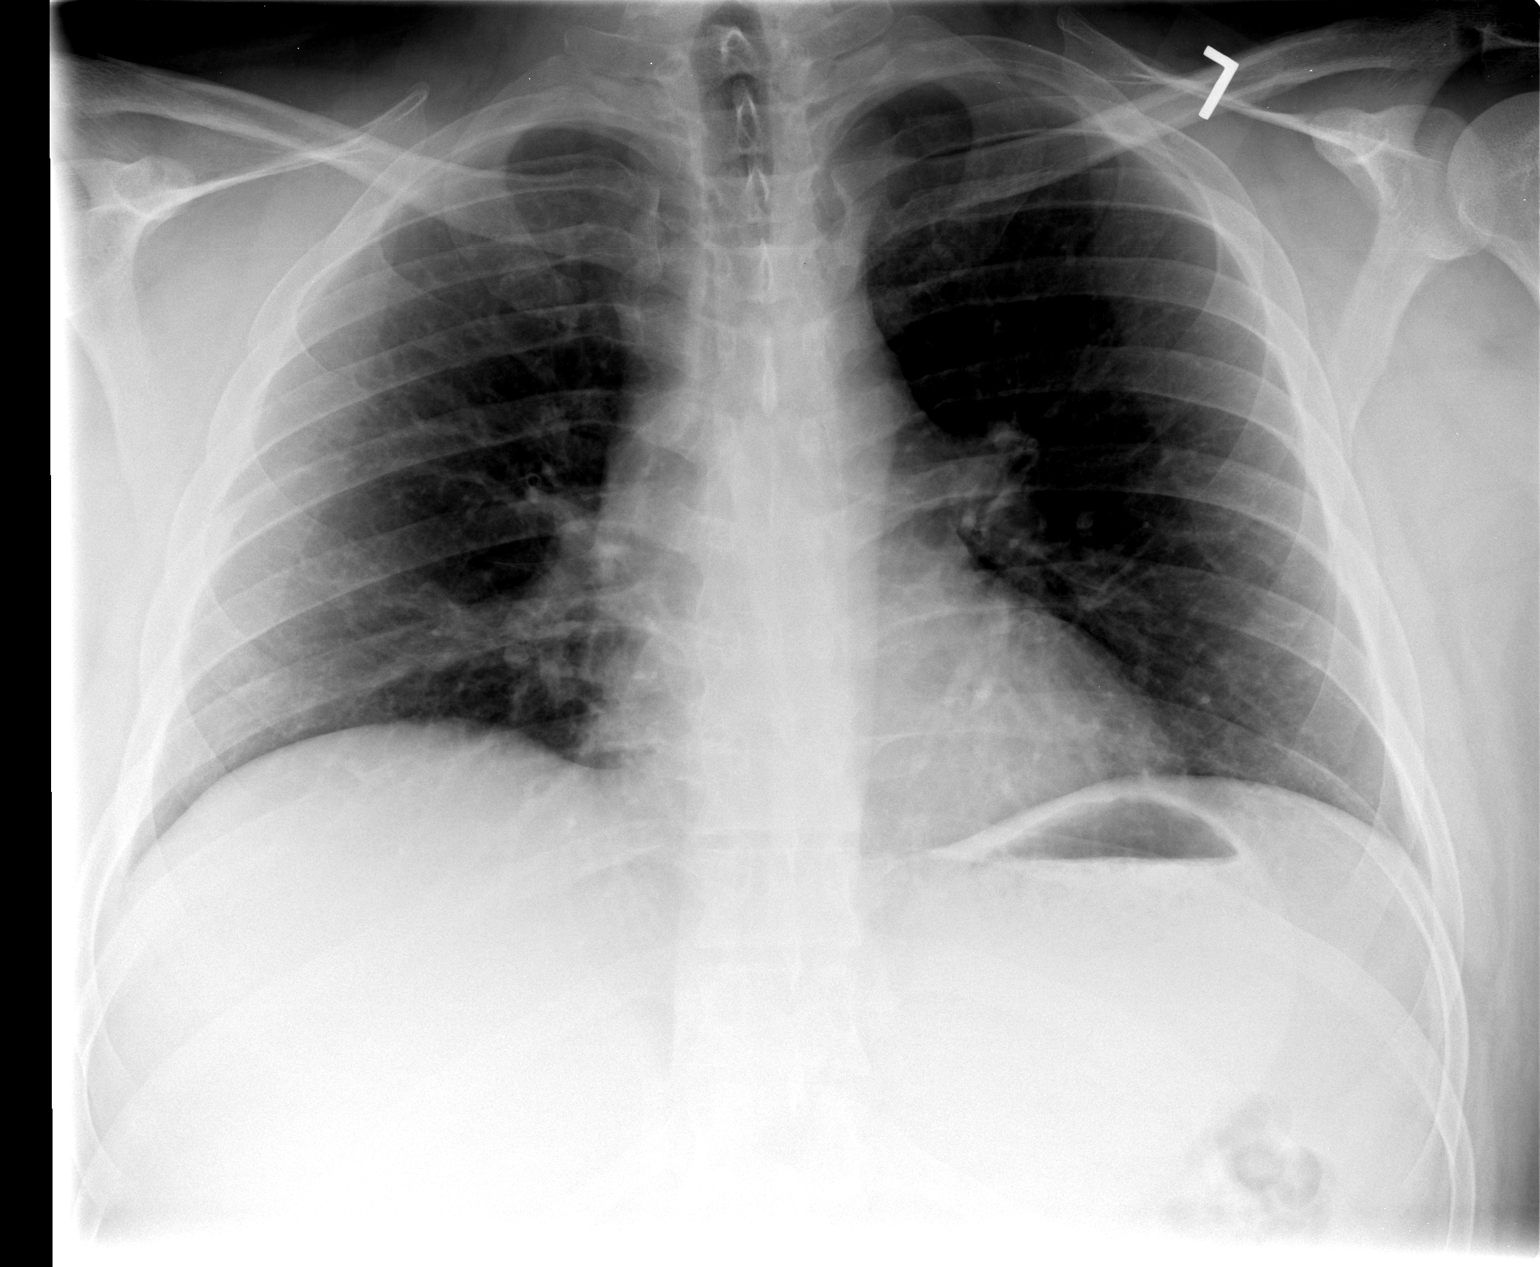

[view not recorded (2 of 2)]
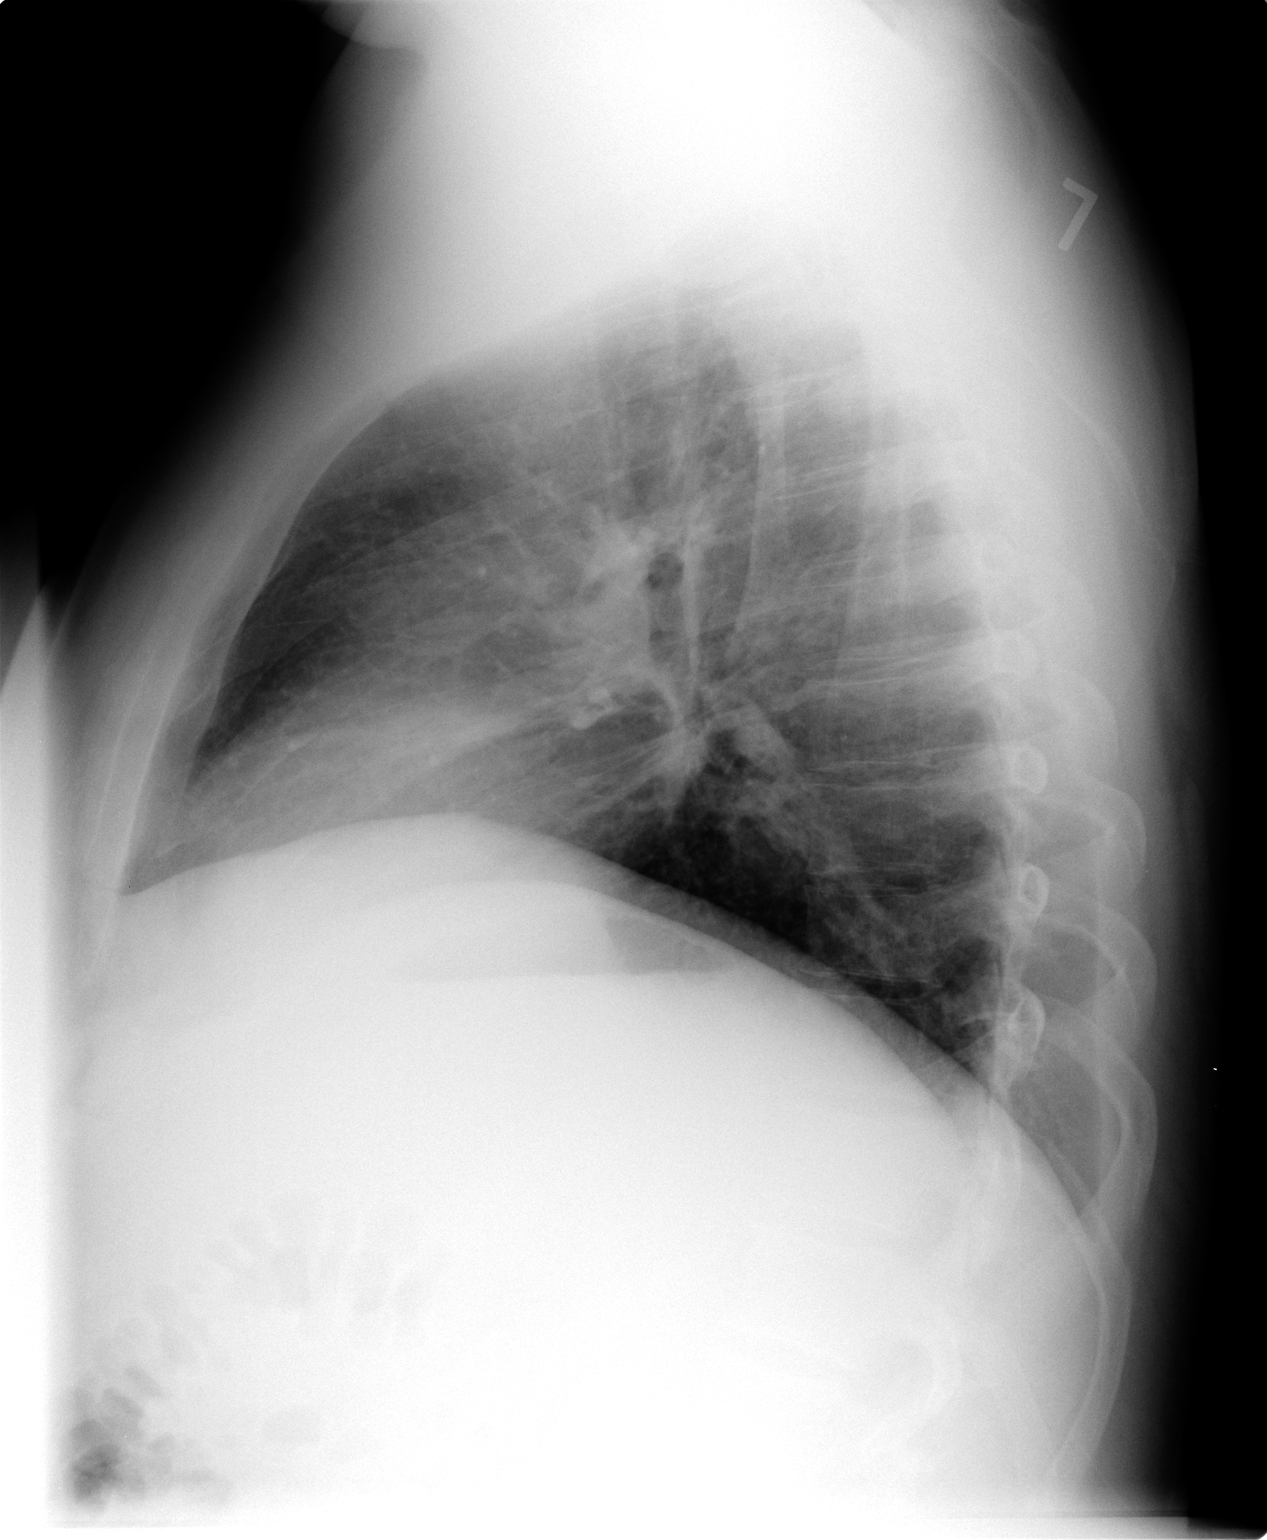

[2 of 2 positions shown; findings below may reference images not displayed]

FINDINGS: The heart size and mediastinal contours are within normal limits.
Both lungs are clear. No pneumothorax or pleural effusion is noted.
The visualized skeletal structures are unremarkable.
IMPRESSION: No acute cardiopulmonary abnormality seen.

## 2018-02-01 IMAGING — CR DG CHEST 2V
2 series · 2 of 2 positions shown · non-contrast
Comparison: February 07, 2014.

CLINICAL DATA: Productive cough, shortness of breath.

EXAM:
CHEST  2 VIEW

[chest pa]
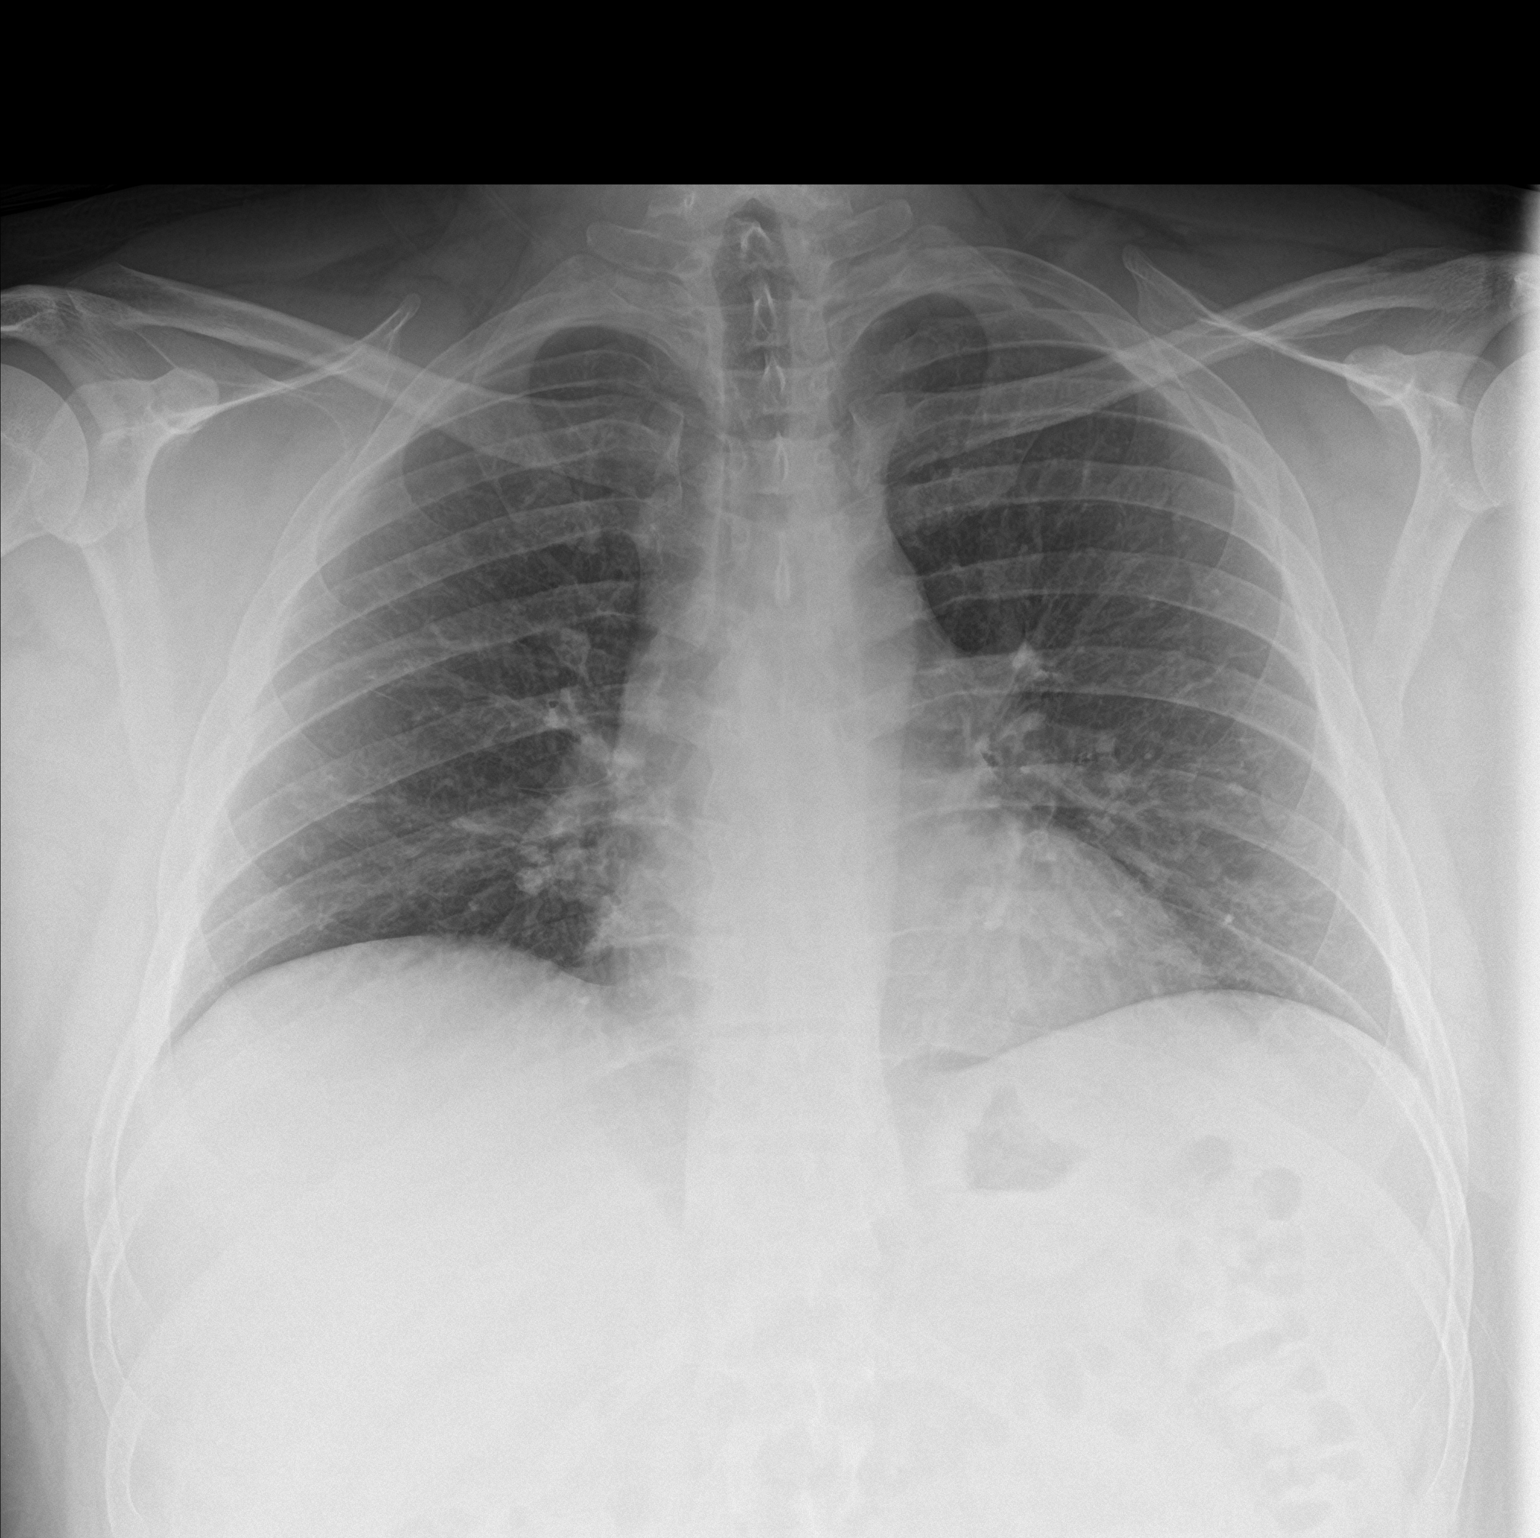

[chest lat]
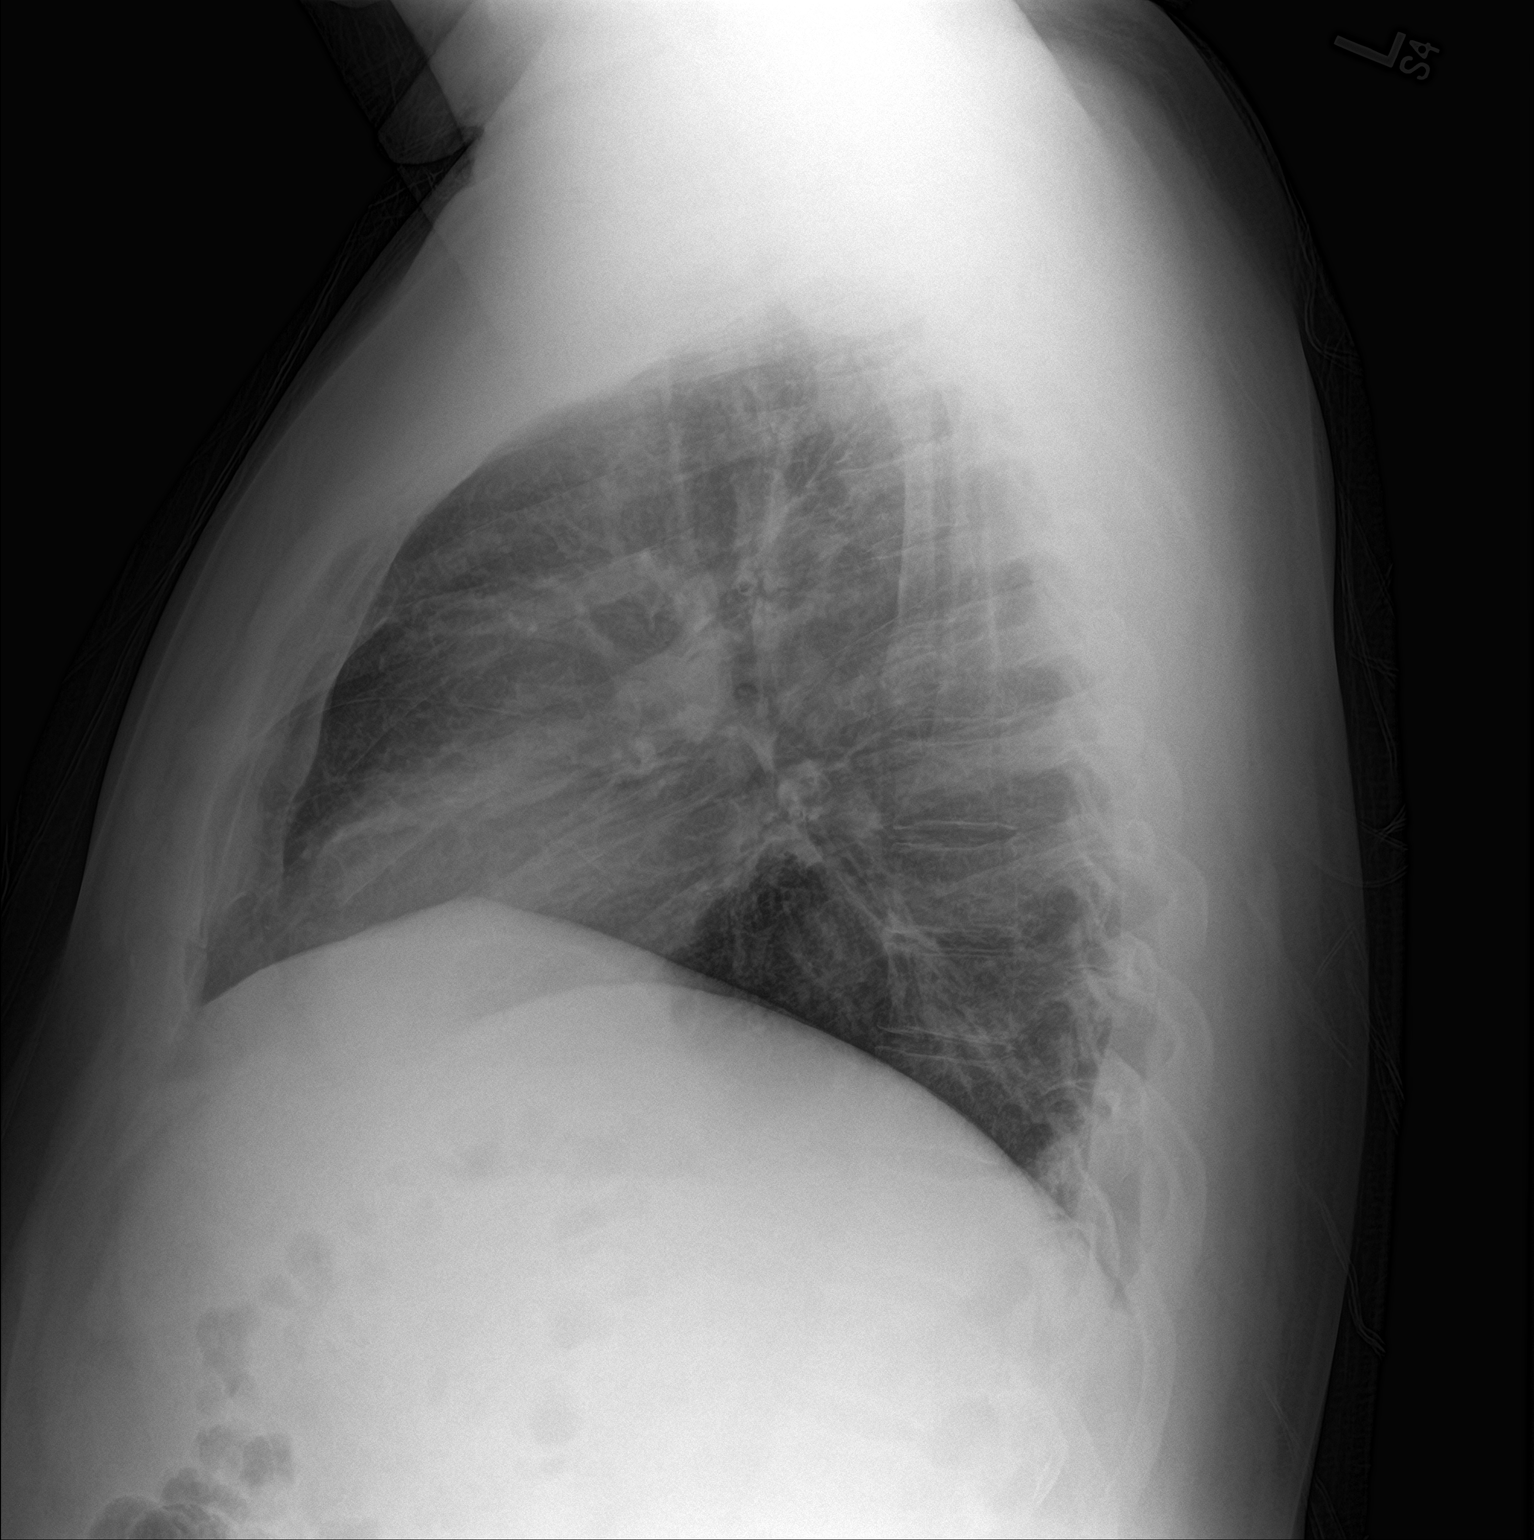

[2 of 2 positions shown; findings below may reference images not displayed]

FINDINGS: The heart size and mediastinal contours are within normal limits.
Both lungs are clear. No pneumothorax or pleural effusion is noted.
The visualized skeletal structures are unremarkable.
IMPRESSION: No active cardiopulmonary disease.
